# Patient Record
Sex: Female | Born: 1950 | Race: White | Hispanic: No | Marital: Married
Health system: Southern US, Community
[De-identification: ages and names within clinical notes are randomized; demographics above are authoritative.]

---

## 2008-04-05 ENCOUNTER — Ambulatory Visit (HOSPITAL_BASED_OUTPATIENT_CLINIC_OR_DEPARTMENT_OTHER): Admission: RE | Admit: 2008-04-05 | Discharge: 2008-04-05 | Payer: Self-pay | Admitting: Family Medicine

## 2008-04-05 ENCOUNTER — Ambulatory Visit: Payer: Self-pay | Admitting: Diagnostic Radiology

## 2010-04-16 IMAGING — CR DG HAND 3V BILAT
6 series · 6 of 6 positions shown · non-contrast
Comparison: None

CLINICAL DATA: Diffuse arthralgia.

BILATERAL HAND - 3 VIEW

[x hand pa left]
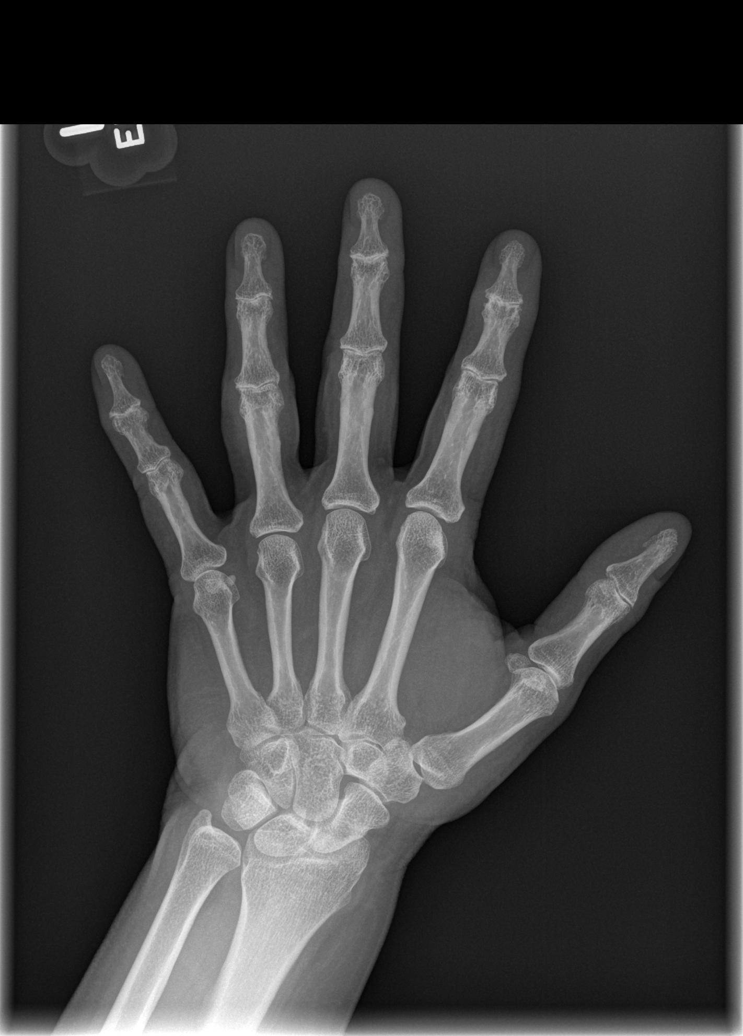

[x hand oblique left]
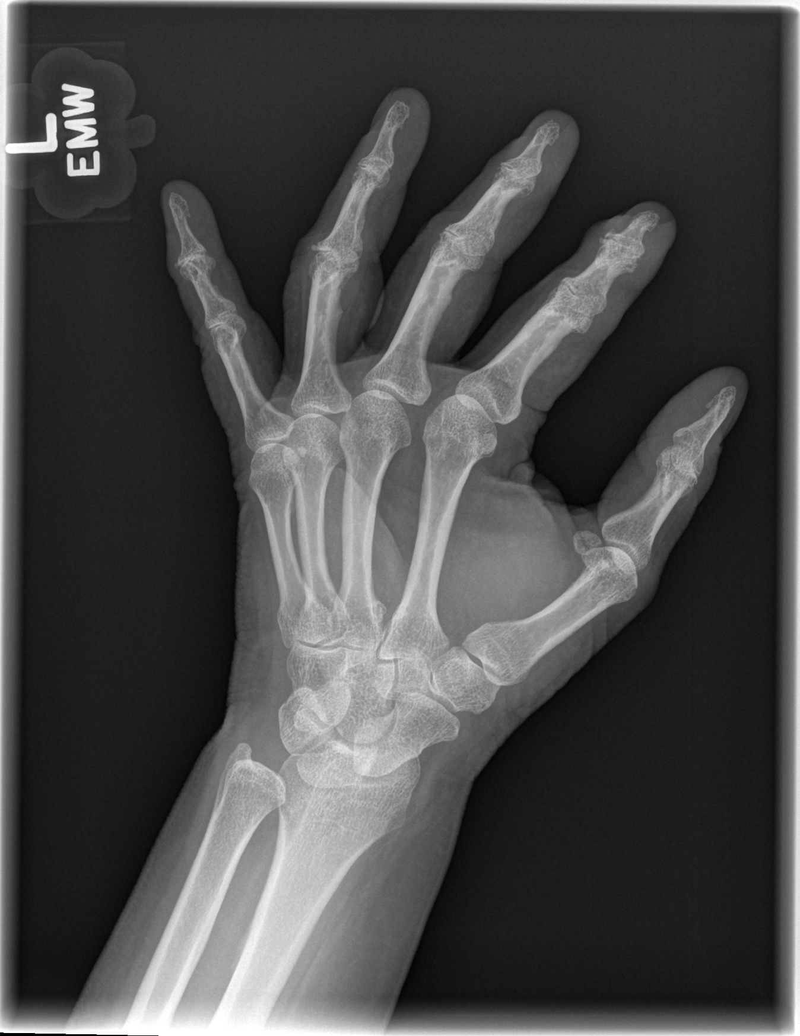

[x hand lat left]
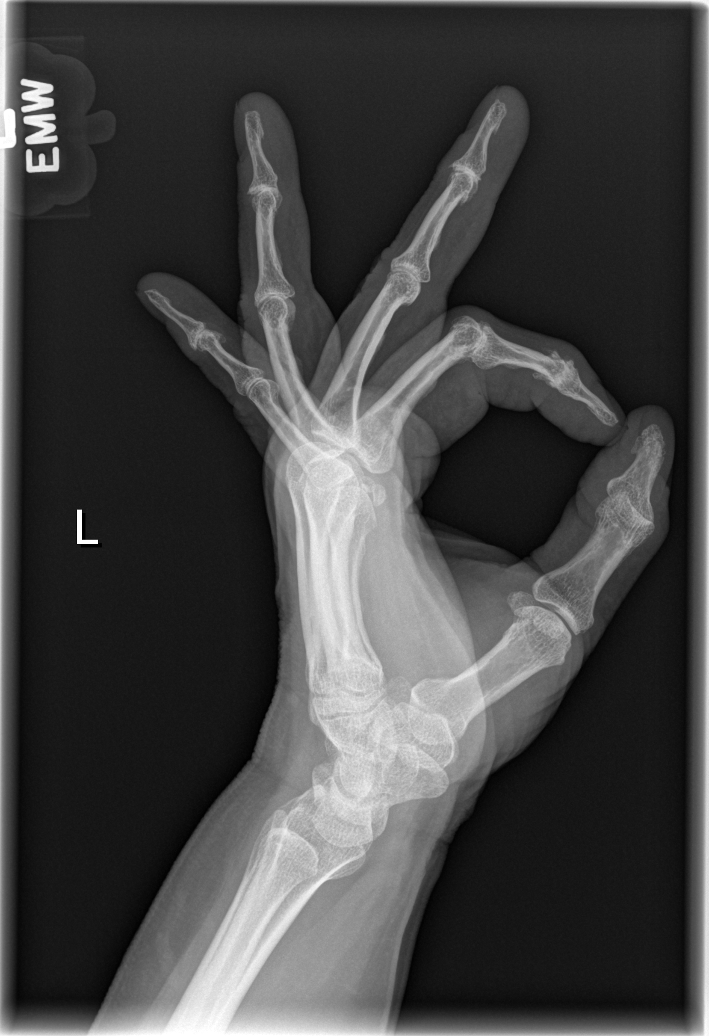

[x hand pa right]
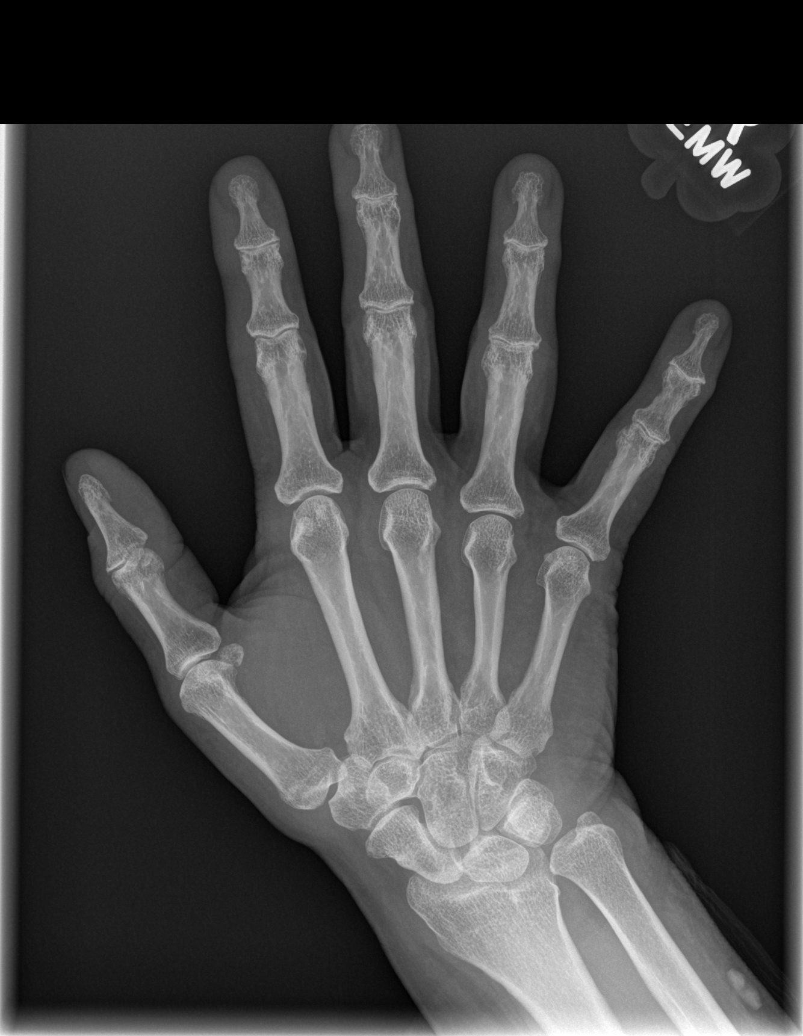

[x hand oblique right]
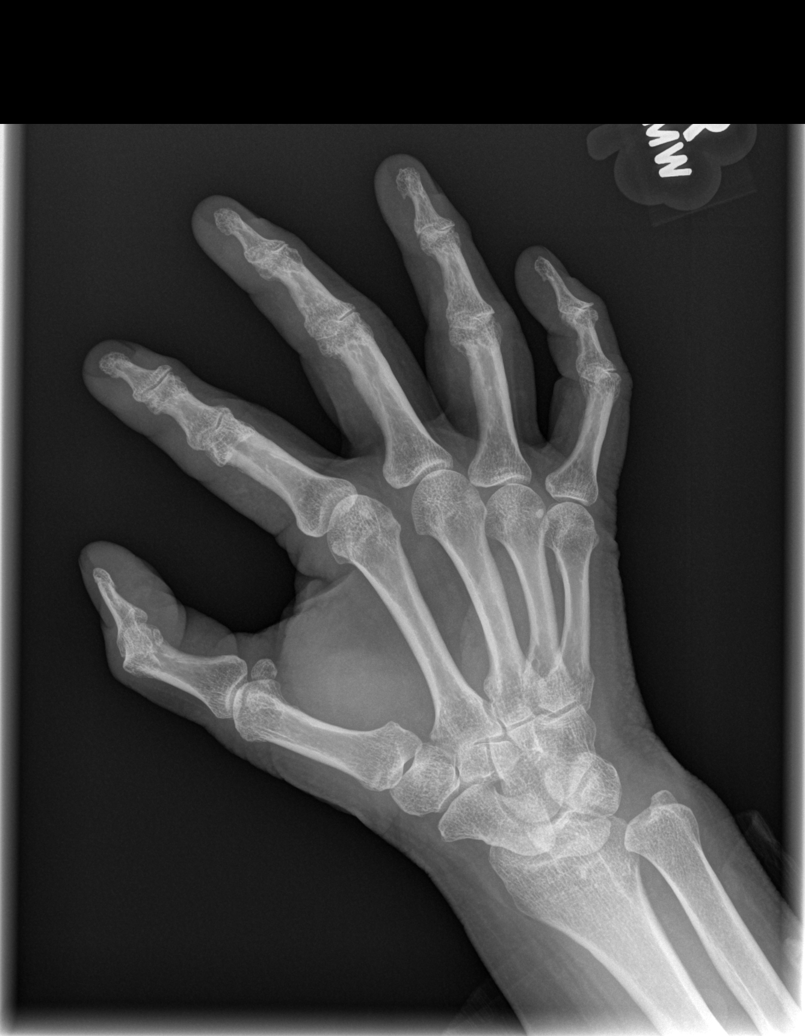

[x hand lat right]
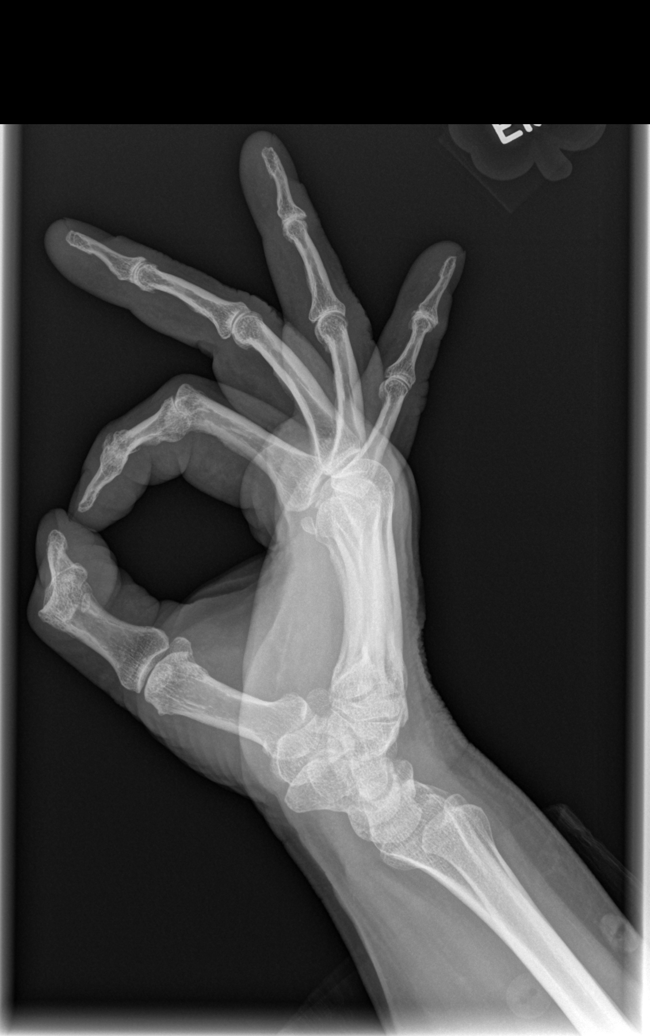

[6 of 6 positions shown; findings below may reference images not displayed]

FINDINGS: Three views of the left hand show changes of
osteoarthritis of the interphalangeal joints affecting all the
fingers.  This includes joint space narrowing with marginal
osteophytes.  No osteoporosis.  Metacarpal phalangeal joints appear
normal. No sign of erosive arthritis. No significant arthritis in
the carpal region.

Three views of the right hand also show interphalangeal
osteoarthritis, not quite as pronounced as on the left.  Metacarpal
phalangeal joints are normal.  No evidence of erosive arthritis.
Carpus appears unremarkable.
IMPRESSION: Typical findings of osteoarthritis of the interphalangeal joints,
more pronounced affecting the left hand in the right.

## 2012-11-10 ENCOUNTER — Ambulatory Visit: Payer: Self-pay | Admitting: Family Medicine

## 2021-11-10 ENCOUNTER — Ambulatory Visit (INDEPENDENT_AMBULATORY_CARE_PROVIDER_SITE_OTHER): Payer: Managed Care, Other (non HMO) | Admitting: Orthopaedic Surgery

## 2021-11-10 ENCOUNTER — Encounter: Payer: Self-pay | Admitting: Orthopaedic Surgery

## 2021-11-10 ENCOUNTER — Ambulatory Visit (INDEPENDENT_AMBULATORY_CARE_PROVIDER_SITE_OTHER): Payer: Managed Care, Other (non HMO)

## 2021-11-10 ENCOUNTER — Ambulatory Visit: Payer: Self-pay

## 2021-11-10 DIAGNOSIS — M25561 Pain in right knee: Secondary | ICD-10-CM

## 2021-11-10 DIAGNOSIS — M170 Bilateral primary osteoarthritis of knee: Secondary | ICD-10-CM | POA: Diagnosis not present

## 2021-11-10 DIAGNOSIS — G8929 Other chronic pain: Secondary | ICD-10-CM | POA: Diagnosis not present

## 2021-11-10 DIAGNOSIS — M25562 Pain in left knee: Secondary | ICD-10-CM

## 2021-11-10 NOTE — Progress Notes (Signed)
Office Visit Note   Patient: Bailey Glover           Date of Birth: 01-Aug-1950           MRN: [id number] Visit Date: 11/10/2021              Requested by: No referring provider defined for this encounter. PCP: No primary care provider on file.   Assessment & Plan: Visit Diagnoses:  1. Chronic pain of right knee   2. Chronic pain of left knee   3. Primary osteoarthritis of both knees     Plan: Bailey Glover is a pleasant 71 year old woman with a long history of bilateral knee pain right worse than left.  Denies any specific injuries.  She was followed for this when she lived in [place].  She tried injections but did not find them particularly helpful.  She is also tried Voltaren gel anti-inflammatories and homeopathic treatments such as turmeric and apple cider vinegar.  Course of viscosupplementation was also not helpful the last month her pain is gotten worse.  Hurts her especially going up and down stairs.  She is not interested in surgery at this point.  Her x-rays do show advanced tricompartmental varus arthritis of both of her knees with subchondral sclerosis periarticular osteophytes and loss of joint space in all 3 compartments of both knees.  She did mention that she had a friend who did get a knee brace here and is found it particularly helpful.  We gave her a right medial unloading spider brace.  This gave her significant relief when she was wearing it and she requested if she could have 1 for her left knee as well especially when she goes hiking.  She may follow-up as needed  Follow-Up Instructions: Return if symptoms worsen or fail to improve.   Orders:  Orders Placed This Encounter  Procedures   XR KNEE 3 VIEW RIGHT   XR KNEE 3 VIEW LEFT   No orders of the defined types were placed in this encounter.     Procedures: No procedures performed   Clinical Data: No additional findings.   Subjective: Chief Complaint  Patient presents with   Right Knee - Pain   Left Knee  - Pain    HPI Bailey Glover is a pleasant 71 year old woman who comes in today for evaluation of right greater than worse left knee pain has been going on for quite a few years.  She has tried conservative and treatment including injections oral anti-inflammatories and really has not gotten any relief.  She is not interested in knee replacement at this point Review of Systems  All other systems reviewed and are negative.    Objective: Vital Signs: There were no vitals taken for this visit.  Physical Exam Constitutional:      Appearance: Normal appearance.  Pulmonary:     Effort: Pulmonary effort is normal.  Skin:    General: Skin is warm and dry.  Neurological:     General: No focal deficit present.     Mental Status: She is alert.     Ortho Exam Examination of her knees with weightbearing she has clinical varus alignment she has crepitus with range of motion of both knees.  She has no effusion or erythema.  She has good varus and valgus stability.  She is mostly tender over the right greater than left medial joint line and patellofemoral joint.  She is neurovascular intact Specialty Comments:  No specialty comments available.  Imaging:  XR KNEE 3 VIEW LEFT  Result Date: 11/10/2021 Three-view radiographs of her left knee were obtained today.  She has advanced tricompartmental arthritis with subsequent chondral sclerosis and periarticular osteophytes.  No acute fractures.  She does have varus alignment of about 10 degrees.  XR KNEE 3 VIEW RIGHT  Result Date: 11/10/2021 Three-view radiographs of her right knee were obtained today.  She has varus alignment with tricompartmental degenerative changes with some chondral sclerosis and periarticular osteophytes she has significant loss of joint space.  Approximately 10 degrees of varus.  Films are consistent with advanced osteoarthritis without any acute change.    PMFS History: Patient Active Problem List   Diagnosis Date Noted    Osteoarthritis of knees, bilateral 11/10/2021   History reviewed. No pertinent past medical history.  History reviewed. No pertinent family history.  History reviewed. No pertinent surgical history. Social History   Occupational History   Not on file  Tobacco Use   Smoking status: Not on file   Smokeless tobacco: Not on file  Substance and Sexual Activity   Alcohol use: Not on file   Drug use: Not on file   Sexual activity: Not on file

## 2021-12-08 ENCOUNTER — Ambulatory Visit (INDEPENDENT_AMBULATORY_CARE_PROVIDER_SITE_OTHER): Payer: Managed Care, Other (non HMO) | Admitting: Orthopaedic Surgery

## 2021-12-08 ENCOUNTER — Encounter: Payer: Self-pay | Admitting: Orthopaedic Surgery

## 2021-12-08 DIAGNOSIS — M1711 Unilateral primary osteoarthritis, right knee: Secondary | ICD-10-CM | POA: Diagnosis not present

## 2021-12-08 DIAGNOSIS — M170 Bilateral primary osteoarthritis of knee: Secondary | ICD-10-CM

## 2021-12-08 MED ORDER — METHYLPREDNISOLONE ACETATE 40 MG/ML IJ SUSP
80.0000 mg | INTRAMUSCULAR | Status: AC | PRN
  Administered 2021-12-08: 80 mg via INTRA_ARTICULAR

## 2021-12-08 MED ORDER — BUPIVACAINE HCL 0.25 % IJ SOLN
2.0000 mL | INTRAMUSCULAR | Status: AC | PRN
  Administered 2021-12-08: 2 mL via INTRA_ARTICULAR

## 2021-12-08 MED ORDER — LIDOCAINE HCL 1 % IJ SOLN
2.0000 mL | INTRAMUSCULAR | Status: AC | PRN
  Administered 2021-12-08: 2 mL

## 2021-12-08 NOTE — Progress Notes (Signed)
Office Visit Note   Patient: Bailey Glover           Date of Birth: 19-Jun-1950           MRN: [id number] Visit Date: 12/08/2021              Requested by: No referring provider defined for this encounter. PCP: Pcp, No   Assessment & Plan: Visit Diagnoses:  1. Primary osteoarthritis of both knees     Plan: [name] presents in follow-up today for her knees.  She has a history of bilateral knee arthritis  She has had steroid injections in the past when she lived in [place] she did not think these helped but this was quite a few years ago.  She also had viscosupplementation there which did not seem to help.  At her last visit she asked about braces.these were applied but did not seem to help her right knee.  While her left knee is doing well she has significant pain over the medial side of her right knee.  No fever chills no erythema no redness.  She has significant tricompartmental arthritis though most significant in the medial compartment with associated varus.  Discussed with her that we could try another cortisone injection.  ultimately to relieve her symptoms the only permanent solution would be a knee replacement.  She would like to try an injection today  Follow-Up Instructions: Return if symptoms worsen or fail to improve.   Orders:  Orders Placed This Encounter  Procedures   Large Joint Inj: R knee   No orders of the defined types were placed in this encounter.     Procedures: Large Joint Inj: R knee on 12/08/2021 3:44 PM Indications: pain and diagnostic evaluation Details: 25 G 1.5 in needle, anteromedial approach  Arthrogram: No  Medications: 80 mg methylPREDNISolone acetate 40 MG/ML; 2 mL lidocaine 1 %; 2 mL bupivacaine 0.25 % Outcome: tolerated well, no immediate complications Procedure, treatment alternatives, risks and benefits explained, specific risks discussed. Consent was given by the patient.       Clinical Data: No additional  findings.   Subjective: Chief Complaint  Patient presents with   Right Knee - Pain    HPI Bailey Glover is a pleasant 71 year old woman with a history of bilateral knee arthritis.  She has had viscosupplementation and cortisone injections in the past in [place] though its been a while.  She saw [place] a few weeks ago and was wondering if knee braces would be helpful.  While the brace is helped her left knee she is found that the right knee is actually more painful Review of Systems  All other systems reviewed and are negative.    Objective: Vital Signs: There were no vitals taken for this visit.  Physical Exam Constitutional:      Appearance: Normal appearance.  Pulmonary:     Effort: Pulmonary effort is normal.  Skin:    General: Skin is warm and dry.  Neurological:     General: No focal deficit present.     Mental Status: She is alert.     Ortho Exam Examination of her right knee she is clinical varus alignment.  She has no effusion no warmth no erythema.  Compartments are soft and nontender.  She is tender to palpation over the medial compartment good stability with varus valgus stress Specialty Comments:  No specialty comments available.  Imaging: No results found.   PMFS History: Patient Active Problem List   Diagnosis Date  Noted   Osteoarthritis of knees, bilateral 11/10/2021   History reviewed. No pertinent past medical history.  History reviewed. No pertinent family history.  History reviewed. No pertinent surgical history. Social History   Occupational History   Not on file  Tobacco Use   Smoking status: Not on file   Smokeless tobacco: Not on file  Substance and Sexual Activity   Alcohol use: Not on file   Drug use: Not on file   Sexual activity: Not on file

## 2022-04-29 ENCOUNTER — Encounter: Payer: Managed Care, Other (non HMO) | Admitting: Radiology
